# Patient Record
Sex: Male | Born: 1960 | Race: White | Hispanic: No | Smoking: Current every day smoker
Health system: Southern US, Community
[De-identification: ages and names within clinical notes are randomized; demographics above are authoritative.]

## PROBLEM LIST (undated history)

## (undated) DIAGNOSIS — I10 Essential (primary) hypertension: Secondary | ICD-10-CM

## (undated) DIAGNOSIS — J449 Chronic obstructive pulmonary disease, unspecified: Secondary | ICD-10-CM

## (undated) DIAGNOSIS — K219 Gastro-esophageal reflux disease without esophagitis: Secondary | ICD-10-CM

## (undated) HISTORY — PX: OTHER SURGICAL HISTORY: SHX169

---

## 2013-02-10 ENCOUNTER — Encounter (HOSPITAL_COMMUNITY): Payer: Self-pay

## 2013-02-10 ENCOUNTER — Emergency Department (HOSPITAL_COMMUNITY): Payer: 59

## 2013-02-10 ENCOUNTER — Emergency Department (HOSPITAL_COMMUNITY)
Admission: EM | Admit: 2013-02-10 | Discharge: 2013-02-11 | Disposition: A | Discharge status: Home / Self Care | Payer: 59 | Attending: Emergency Medicine | Admitting: Emergency Medicine

## 2013-02-10 DIAGNOSIS — J3489 Other specified disorders of nose and nasal sinuses: Secondary | ICD-10-CM | POA: Insufficient documentation

## 2013-02-10 DIAGNOSIS — R0682 Tachypnea, not elsewhere classified: Secondary | ICD-10-CM | POA: Insufficient documentation

## 2013-02-10 DIAGNOSIS — J441 Chronic obstructive pulmonary disease with (acute) exacerbation: Secondary | ICD-10-CM | POA: Insufficient documentation

## 2013-02-10 DIAGNOSIS — F17200 Nicotine dependence, unspecified, uncomplicated: Secondary | ICD-10-CM | POA: Insufficient documentation

## 2013-02-10 HISTORY — DX: Chronic obstructive pulmonary disease, unspecified: J44.9

## 2013-02-10 IMAGING — CR DG CHEST 1V PORT
1 series · 1 of 1 positions shown · non-contrast
Comparison: None.

CLINICAL DATA: Shortness of breath and wheezing.  History of COPD
and asthma.

PORTABLE CHEST - 1 VIEW

[portable]
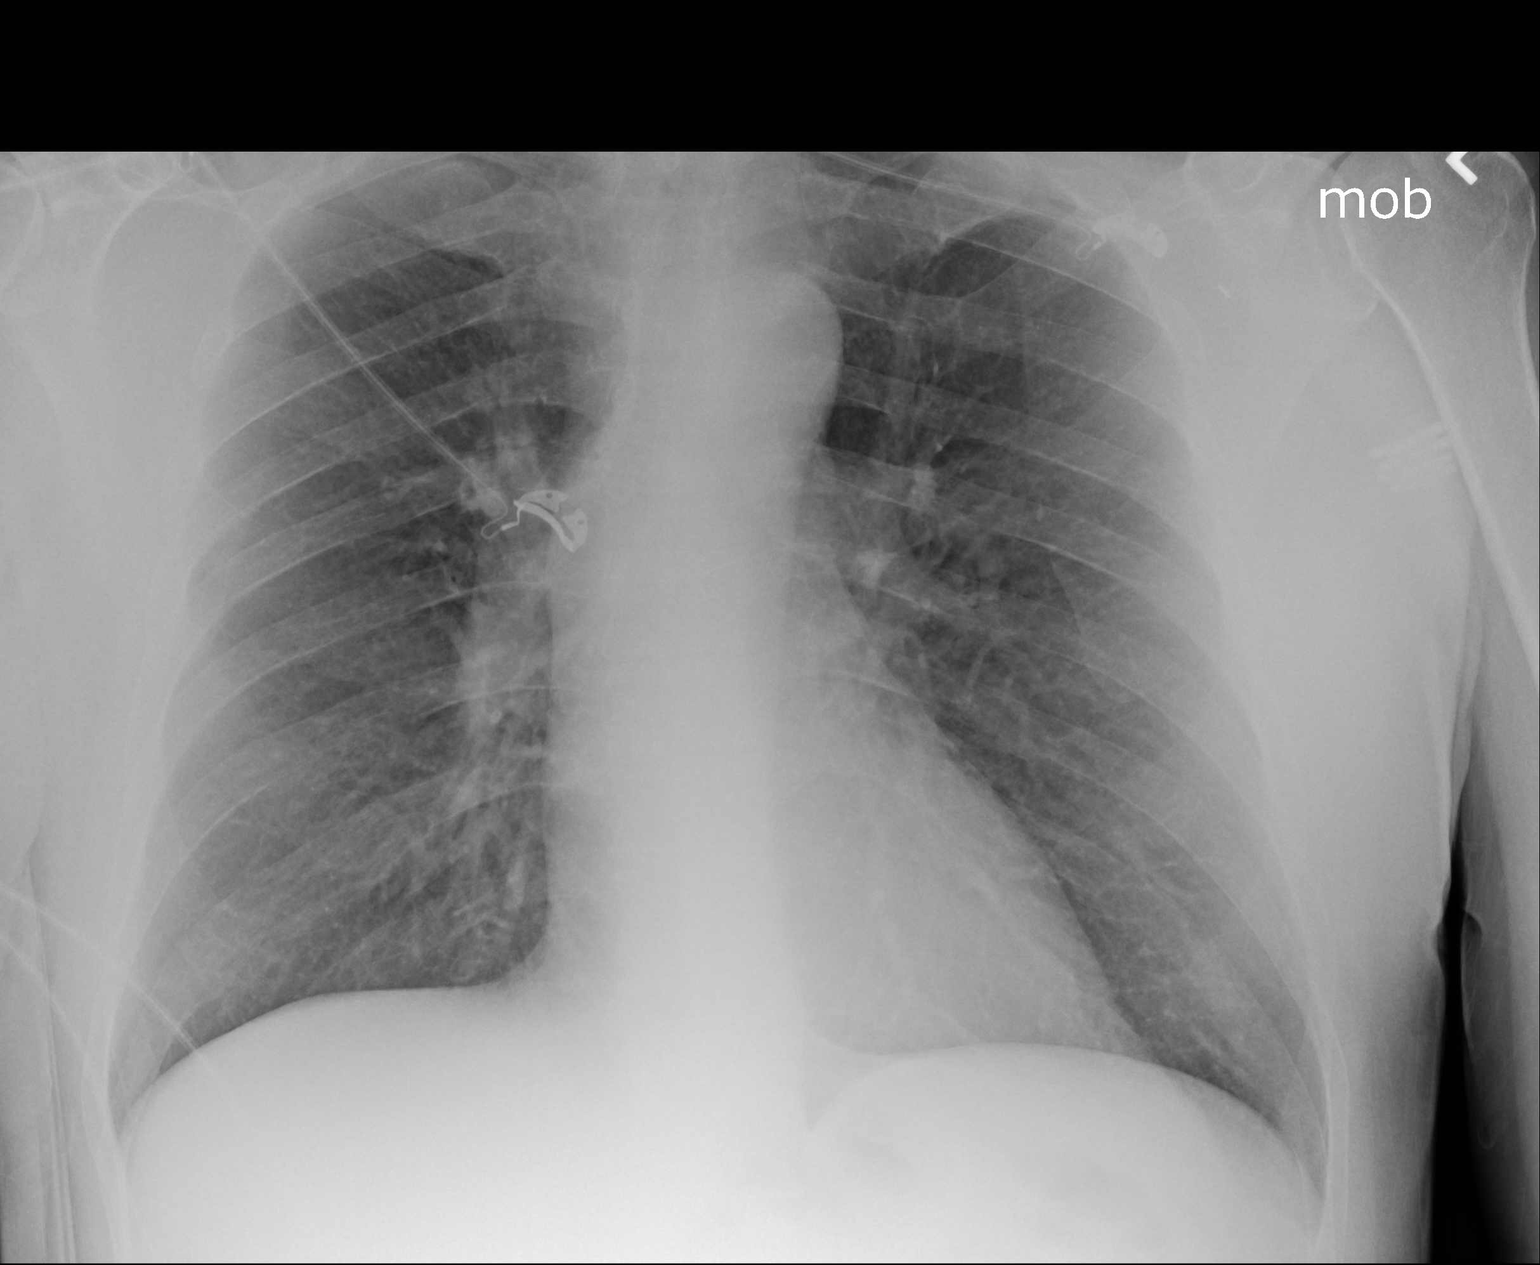

[1 of 1 positions shown; findings below may reference images not displayed]

FINDINGS: Mild hyperinflation and peribronchial thickening
compatible with history of COPD and asthma.  No focal consolidation
or airspace disease in the lungs.  No blunting of costophrenic
angles.  No pneumothorax.  Normal heart size and pulmonary
vascularity.  Mediastinal contours appear intact.
IMPRESSION: Mild hyperinflation and peribronchial changes compatible with
history of COPD and asthma.  No evidence of active pulmonary
disease.

## 2013-02-10 MED ORDER — ALBUTEROL SULFATE (5 MG/ML) 0.5% IN NEBU
5.0000 mg | INHALATION_SOLUTION | RESPIRATORY_TRACT | Status: DC
  Administered 2013-02-10: 5 mg via RESPIRATORY_TRACT
  Filled 2013-02-10: qty 1

## 2013-02-10 MED ORDER — IPRATROPIUM BROMIDE 0.02 % IN SOLN
0.5000 mg | Freq: Once | RESPIRATORY_TRACT | Status: AC
  Administered 2013-02-10: 0.5 mg via RESPIRATORY_TRACT
  Filled 2013-02-10: qty 2.5

## 2013-02-10 MED ORDER — METHYLPREDNISOLONE SODIUM SUCC 125 MG IJ SOLR
125.0000 mg | Freq: Once | INTRAMUSCULAR | Status: AC
  Administered 2013-02-10: 125 mg via INTRAVENOUS
  Filled 2013-02-10: qty 2

## 2013-02-10 NOTE — ED Notes (Signed)
Sob, chest pressure/tightness, wheezing

## 2013-02-10 NOTE — ED Provider Notes (Signed)
CSN: [id number]     Arrival date & time 02/10/13  2318 History   First MD Initiated Contact with Patient 02/10/13 2321     Chief Complaint  Patient presents with  . Shortness of Breath  . Wheezing   (Consider location/radiation/quality/duration/timing/severity/associated sxs/prior Treatment) HPI History provided by patient. Is a smoker with history of COPD. Presents with increased cough, wheezing and shortness of breath despite using inhaler at home. Recent sick contact with sinusitis and today patient is developing some runny nose and sinus pressure. No fevers. No hemoptysis. No productive sputum. He denies any chest pain, leg swelling or recent travel. Symptoms moderate in severity and worse with exertion. Past Medical History  Diagnosis Date  . COPD (chronic obstructive pulmonary disease)    History reviewed. No pertinent past surgical history. No family history on file. History  Substance Use Topics  . Smoking status: Current Every Day Smoker  . Smokeless tobacco: Not on file  . Alcohol Use: No    Review of Systems  Constitutional: Negative for fever and chills.  HENT: Positive for rhinorrhea and sinus pressure. Negative for neck pain and neck stiffness.   Eyes: Negative for pain.  Respiratory: Positive for cough, shortness of breath and wheezing.   Cardiovascular: Negative for chest pain and leg swelling.  Gastrointestinal: Negative for abdominal pain.  Genitourinary: Negative for dysuria.  Musculoskeletal: Negative for back pain.  Skin: Negative for rash.  Neurological: Negative for headaches.  All other systems reviewed and are negative.    Allergies  Review of patient's allergies indicates no known allergies.  Home Medications  No current outpatient prescriptions on file. BP 163/95  Temp(Src) 98.3 F (36.8 C) (Oral)  Resp 24  Ht 5\' 11"  (1.803 m)  Wt 210 lb (95.255 kg)  BMI 29.3 kg/m2  SpO2 95% Physical Exam  Constitutional: He is oriented to person, place,  and time. He appears well-developed and well-nourished.  HENT:  Head: Normocephalic and atraumatic.  Eyes: EOM are normal. Pupils are equal, round, and reactive to light.  Neck: Neck supple.  Cardiovascular: Normal rate, regular rhythm and intact distal pulses.   Pulmonary/Chest: No stridor.  Tachypneic with bilateral inspiratory and expiratory wheezes, and with prolonged expirations  Abdominal: Soft. He exhibits no distension. There is no tenderness.  Musculoskeletal: Normal range of motion. He exhibits no edema and no tenderness.  Neurological: He is alert and oriented to person, place, and time.  Skin: Skin is warm and dry.    ED Course  Procedures (including critical care time) Labs Review Labs Reviewed  BASIC METABOLIC PANEL - Abnormal; Notable for the following:    Glucose, Bld 110 (*)    GFR calc non Af Amer 67 (*)    GFR calc Af Amer 78 (*)    All other components within normal limits  CBC  TROPONIN I   Imaging Review Dg Chest Portable 1 View  02/10/2013   *RADIOLOGY REPORT*  Clinical Data: Shortness of breath and wheezing.  History of COPD and asthma.  PORTABLE CHEST - 1 VIEW  Comparison: None.  Findings: Mild hyperinflation and peribronchial thickening compatible with history of COPD and asthma.  No focal consolidation or airspace disease in the lungs.  No blunting of costophrenic angles.  No pneumothorax.  Normal heart size and pulmonary vascularity.  Mediastinal contours appear intact.  IMPRESSION: Mild hyperinflation and peribronchial changes compatible with history of COPD and asthma.  No evidence of active pulmonary disease.   Original Report Authenticated By: Burman Nieves,  M.D.     Date: 02/10/2013  Rate: 91  Rhythm: normal sinus rhythm  QRS Axis: normal  Intervals: normal  ST/T Wave abnormalities: nonspecific ST changes  Conduction Disutrbances:none  Narrative Interpretation:   Old EKG Reviewed: none available  IV steroids, albuterol/Atrovent  provided. 12:00 AM recheck, still wheezing, placed on continuous neb. 12:55 AM minimal improvement, still wheezing with increased work of breathing. I recommended the patient be admitted - he states understanding this 1:57 AM still wheezing. I had a long discussion with patient again recommended admission. Patient very much does not want to be admitted, states he needs to work in the morning and that he always wheezes and he believes that he will be okay.  He understands that he can return at any time. She given an inhaler to go home with. Prescription for albuterol, Spiriva, prednisone. He agrees to fill prescriptions and take them as prescribed. He agrees to return precautions and states he'll return if he gets worse. AMA paperwork provided.  MDM  Diagnosis: 1. Acute COPD exacerbation  2. Sinusitis 3. Borderline hypoxia  EKG. Chest x-ray. Labs.  Minimal improvement with medications provided  Patient left AMA despite strong recommendations for admission  Sunnie Nielsen, MD 02/11/13 4098

## 2013-02-10 NOTE — ED Notes (Signed)
Xray and RT at the bedside 

## 2013-02-11 LAB — CBC
MCH: 32.3 pg (ref 26.0–34.0)
MCHC: 34.6 g/dL (ref 30.0–36.0)
MCV: 93.4 fL (ref 78.0–100.0)
Platelets: 215 10*3/uL (ref 150–400)
RBC: 5.01 MIL/uL (ref 4.22–5.81)

## 2013-02-11 LAB — BASIC METABOLIC PANEL
CO2: 29 mEq/L (ref 19–32)
Calcium: 9.2 mg/dL (ref 8.4–10.5)
Creatinine, Ser: 1.2 mg/dL (ref 0.50–1.35)
Glucose, Bld: 110 mg/dL — ABNORMAL HIGH (ref 70–99)

## 2013-02-11 MED ORDER — KETOROLAC TROMETHAMINE 30 MG/ML IJ SOLN
INTRAMUSCULAR | Status: AC
  Administered 2013-02-11: 30 mg via INTRAVENOUS
  Filled 2013-02-11: qty 1

## 2013-02-11 MED ORDER — PREDNISONE 20 MG PO TABS: 60.0000 mg | ORAL_TABLET | Freq: Every day | ORAL | Status: DC

## 2013-02-11 MED ORDER — KETOROLAC TROMETHAMINE 30 MG/ML IJ SOLN
30.0000 mg | Freq: Once | INTRAMUSCULAR | Status: AC
  Administered 2013-02-11: 30 mg via INTRAVENOUS

## 2013-02-11 MED ORDER — TIOTROPIUM BROMIDE MONOHYDRATE 18 MCG IN CAPS: 18.0000 ug | ORAL_CAPSULE | Freq: Every day | RESPIRATORY_TRACT | Status: DC

## 2013-02-11 MED ORDER — ALBUTEROL SULFATE HFA 108 (90 BASE) MCG/ACT IN AERS: 1.0000 | INHALATION_SPRAY | Freq: Four times a day (QID) | RESPIRATORY_TRACT | Status: DC | PRN

## 2013-02-11 MED ORDER — ALBUTEROL SULFATE HFA 108 (90 BASE) MCG/ACT IN AERS
2.0000 | INHALATION_SPRAY | RESPIRATORY_TRACT | Status: DC | PRN
  Administered 2013-02-11: 2 via RESPIRATORY_TRACT
  Filled 2013-02-11: qty 6.7

## 2013-02-11 MED ORDER — ALBUTEROL SULFATE (5 MG/ML) 0.5% IN NEBU
10.0000 mg | INHALATION_SOLUTION | Freq: Once | RESPIRATORY_TRACT | Status: AC
  Administered 2013-02-11: 10 mg via RESPIRATORY_TRACT
  Filled 2013-02-11: qty 2

## 2013-02-11 NOTE — Progress Notes (Signed)
Pt started on cat albuterol

## 2013-02-11 NOTE — ED Notes (Signed)
Ambulated pt around nursing station with O2 Sats 87 for duration

## 2013-02-11 NOTE — ED Notes (Signed)
Patient given discharge instruction, verbalized understand. IV removed, band aid applied. Patient ambulatory out of the department with family 

## 2013-02-11 NOTE — ED Notes (Signed)
Pt complaining of headache, MD advised, orders given

## 2014-01-12 ENCOUNTER — Emergency Department (HOSPITAL_COMMUNITY)
Admission: EM | Admit: 2014-01-12 | Discharge: 2014-01-12 | Disposition: A | Discharge status: Home / Self Care | Payer: BC Managed Care – PPO | Attending: Emergency Medicine | Admitting: Emergency Medicine

## 2014-01-12 ENCOUNTER — Emergency Department (HOSPITAL_COMMUNITY): Payer: BC Managed Care – PPO

## 2014-01-12 ENCOUNTER — Encounter (HOSPITAL_COMMUNITY): Payer: Self-pay | Admitting: Emergency Medicine

## 2014-01-12 DIAGNOSIS — R0781 Pleurodynia: Secondary | ICD-10-CM

## 2014-01-12 DIAGNOSIS — Z7982 Long term (current) use of aspirin: Secondary | ICD-10-CM | POA: Insufficient documentation

## 2014-01-12 DIAGNOSIS — R079 Chest pain, unspecified: Secondary | ICD-10-CM | POA: Insufficient documentation

## 2014-01-12 DIAGNOSIS — Z79899 Other long term (current) drug therapy: Secondary | ICD-10-CM | POA: Insufficient documentation

## 2014-01-12 DIAGNOSIS — J449 Chronic obstructive pulmonary disease, unspecified: Secondary | ICD-10-CM | POA: Insufficient documentation

## 2014-01-12 DIAGNOSIS — J4489 Other specified chronic obstructive pulmonary disease: Secondary | ICD-10-CM | POA: Insufficient documentation

## 2014-01-12 DIAGNOSIS — F17200 Nicotine dependence, unspecified, uncomplicated: Secondary | ICD-10-CM | POA: Insufficient documentation

## 2014-01-12 MED ORDER — CYCLOBENZAPRINE HCL 10 MG PO TABS: 10.0000 mg | ORAL_TABLET | Freq: Two times a day (BID) | ORAL | Status: DC | PRN

## 2014-01-12 MED ORDER — PREDNISONE 50 MG PO TABS: ORAL_TABLET | ORAL | Status: DC

## 2014-01-12 MED ORDER — HYDROMORPHONE HCL PF 1 MG/ML IJ SOLN
1.0000 mg | Freq: Once | INTRAMUSCULAR | Status: AC
  Administered 2014-01-12: 1 mg via INTRAMUSCULAR
  Filled 2014-01-12: qty 1

## 2014-01-12 MED ORDER — OXYCODONE-ACETAMINOPHEN 5-325 MG PO TABS: 2.0000 | ORAL_TABLET | ORAL | Status: DC | PRN

## 2014-01-12 MED ORDER — HYDROMORPHONE HCL PF 1 MG/ML IJ SOLN
1.0000 mg | Freq: Once | INTRAMUSCULAR | Status: DC
  Filled 2014-01-12: qty 1

## 2014-01-12 NOTE — ED Provider Notes (Signed)
CSN: [id number]     Arrival date & time 01/12/14  1035 History  This chart was scribed for [name], MD by Leone Payor, ED Scribe. This patient was seen in room [facility] and the patient's care was started 11:16 AM.    Chief Complaint  Patient presents with  . Rib Injury    The history is provided by the patient. No language interpreter was used.    HPI Comments: Allen Austin is a 53 y.o. male with past medical history of COPD who presents to the Emergency Department complaining of constant, unchanged left inferior, posterior, lateral back pain that began 2 weeks ago. Patient states he was evaluated at an UC last week and was prescribed prednisone, muscle relaxants, and hydrocodone. He states he may have fallen but denies trauma to the affected area. He is able to ambulate. He states the pain is aggravated with deep breaths.   Past Medical History  Diagnosis Date  . COPD (chronic obstructive pulmonary disease)    Past Surgical History  Procedure Laterality Date  . Right knee     No family history on file. History  Substance Use Topics  . Smoking status: Current Every Day Smoker -- 2.00 packs/day    Types: Cigarettes  . Smokeless tobacco: Not on file  . Alcohol Use: Yes     Comment: weekends    Review of Systems  A complete 10 system review of systems was obtained and all systems are negative except as noted in the HPI and PMH.    Allergies  Review of patient's allergies indicates no known allergies.  Home Medications   Prior to Admission medications   Medication Sig Start Date End Date Taking? Authorizing Provider  aspirin EC 81 MG tablet Take 81 mg by mouth daily.   Yes Historical Provider, MD  aspirin-acetaminophen-caffeine (EXCEDRIN EXTRA STRENGTH) [phone] MG per tablet Take 3 tablets by mouth every 8 (eight) hours as needed (back pain).   Yes Historical Provider, MD  cyclobenzaprine (FLEXERIL) 10 MG tablet Take 1 tablet (10 mg total) by mouth 2 (two) times daily  as needed for muscle spasms. 01/12/14   [name], MD  oxyCODONE-acetaminophen (PERCOCET) 5-325 MG per tablet Take 2 tablets by mouth every 4 (four) hours as needed. 01/12/14   [name], MD  predniSONE (DELTASONE) 50 MG tablet 1 tablet daily for 7 days 01/12/14   [name], MD  tiotropium (SPIRIVA HANDIHALER) 18 MCG inhalation capsule Place 1 capsule (18 mcg total) into inhaler and inhale daily. 02/11/13   Sunnie Nielsen, MD   BP 138/119  Pulse 70  Temp(Src) 97.9 F (36.6 C) (Oral)  Resp 20  Ht 5\' 11"  (1.803 m)  Wt 230 lb (104.327 kg)  BMI 32.09 kg/m2  SpO2 93% Physical Exam  Nursing note and vitals reviewed. Constitutional: He is oriented to person, place, and time. He appears well-developed and well-nourished.  HENT:  Head: Normocephalic and atraumatic.  Eyes: Conjunctivae and EOM are normal. Pupils are equal, round, and reactive to light.  Neck: Normal range of motion. Neck supple.  Cardiovascular: Normal rate, regular rhythm and normal heart sounds.   Pulmonary/Chest: Effort normal and breath sounds normal.  Abdominal: Soft. Bowel sounds are normal.  Musculoskeletal: Normal range of motion.  Tender to inferior, posterior, lateral rib area.   Neurological: He is alert and oriented to person, place, and time.  Skin: Skin is warm and dry.  Psychiatric: He has a normal mood and affect. His behavior is normal.  ED Course  Procedures (including critical care time)  DIAGNOSTIC STUDIES: Oxygen Saturation is 98% on RA, normal by my interpretation.    COORDINATION OF CARE: 11:20 AM Discussed treatment plan with pt at bedside and pt agreed to plan.   Labs Review Labs Reviewed - No data to display  Imaging Review Dg Chest 2 View  01/12/2014   CLINICAL DATA:  Rib injury.  EXAM: CHEST - 2 VIEW  COMPARISON:  02/10/2013  FINDINGS: The heart size and mediastinal contours are within normal limits. There is no evidence of pulmonary edema, consolidation, pneumothorax, nodule or pleural fluid.  Stable small piece of shrapnel near the left rib margin and scapula. No visible rib fractures or bony lesions. The thoracic spine appears normal in the lateral projection.  IMPRESSION: No active disease.   Electronically Signed   By: Irish LackGlenn  Yamagata M.D.   On: 01/12/2014 11:49     EKG Interpretation None      MDM   Final diagnoses:  Rib pain on left side    Plain films of the chest show no rib fracture, pneumothorax, consolidation.   Discharge medications Percocet, Flexeril 10 mg, prednisone  I personally performed the services described in this documentation, which was scribed in my presence. The recorded information has been reviewed and is accurate.   Donnetta HutchingBrian Vartan Kerins, MD 01/12/14 1330

## 2014-01-12 NOTE — ED Notes (Signed)
PT stated he may have fell about 2 weeks ago and was seen last Monday at urgent care and was given steriod/valium/hydrocodone. PT has had no relief and pain is worse when he breaths in.

## 2014-01-12 NOTE — Discharge Instructions (Signed)
X-ray shows no obvious broken ribs. Medication for pain, muscle spasm, prednisone. Stop smoking.

## 2014-01-12 NOTE — ED Notes (Signed)
Pt family member out to nursing desk requesting something for pain for pt, update given, Dr Adriana Simasook notified, additional orders provided,

## 2014-09-23 ENCOUNTER — Other Ambulatory Visit (HOSPITAL_COMMUNITY): Payer: Self-pay | Admitting: Internal Medicine

## 2014-09-23 DIAGNOSIS — Z122 Encounter for screening for malignant neoplasm of respiratory organs: Secondary | ICD-10-CM

## 2014-09-29 ENCOUNTER — Ambulatory Visit (HOSPITAL_COMMUNITY)
Admission: RE | Admit: 2014-09-29 | Discharge: 2014-09-29 | Disposition: A | Discharge status: Home / Self Care | Payer: BLUE CROSS/BLUE SHIELD | Source: Ambulatory Visit | Attending: Internal Medicine | Admitting: Internal Medicine

## 2014-09-29 DIAGNOSIS — Z122 Encounter for screening for malignant neoplasm of respiratory organs: Secondary | ICD-10-CM | POA: Insufficient documentation

## 2014-09-29 DIAGNOSIS — Z720 Tobacco use: Secondary | ICD-10-CM | POA: Diagnosis not present

## 2018-09-02 ENCOUNTER — Other Ambulatory Visit: Payer: Self-pay

## 2018-09-02 ENCOUNTER — Encounter (HOSPITAL_COMMUNITY): Payer: Self-pay | Admitting: Emergency Medicine

## 2018-09-02 ENCOUNTER — Emergency Department (HOSPITAL_COMMUNITY)
Admission: EM | Admit: 2018-09-02 | Discharge: 2018-09-02 | Disposition: A | Discharge status: Home / Self Care | Payer: 59 | Attending: Emergency Medicine | Admitting: Emergency Medicine

## 2018-09-02 DIAGNOSIS — J449 Chronic obstructive pulmonary disease, unspecified: Secondary | ICD-10-CM | POA: Insufficient documentation

## 2018-09-02 DIAGNOSIS — I10 Essential (primary) hypertension: Secondary | ICD-10-CM | POA: Diagnosis not present

## 2018-09-02 DIAGNOSIS — Z7982 Long term (current) use of aspirin: Secondary | ICD-10-CM | POA: Insufficient documentation

## 2018-09-02 DIAGNOSIS — J069 Acute upper respiratory infection, unspecified: Secondary | ICD-10-CM | POA: Diagnosis not present

## 2018-09-02 DIAGNOSIS — R05 Cough: Secondary | ICD-10-CM | POA: Diagnosis present

## 2018-09-02 DIAGNOSIS — Z79899 Other long term (current) drug therapy: Secondary | ICD-10-CM | POA: Diagnosis not present

## 2018-09-02 DIAGNOSIS — F17210 Nicotine dependence, cigarettes, uncomplicated: Secondary | ICD-10-CM | POA: Diagnosis not present

## 2018-09-02 HISTORY — DX: Gastro-esophageal reflux disease without esophagitis: K21.9

## 2018-09-02 HISTORY — DX: Essential (primary) hypertension: I10

## 2018-09-02 MED ORDER — AMOXICILLIN 500 MG PO CAPS
1000.0000 mg | ORAL_CAPSULE | Freq: Three times a day (TID) | ORAL | 0 refills | Status: AC
Start: 2018-09-02 — End: ?

## 2018-09-02 MED ORDER — AMOXICILLIN 250 MG PO CAPS
1000.0000 mg | ORAL_CAPSULE | Freq: Once | ORAL | Status: AC
  Administered 2018-09-02: 1000 mg via ORAL
  Filled 2018-09-02: qty 4

## 2018-09-02 MED ORDER — AZITHROMYCIN 250 MG PO TABS: ORAL_TABLET | ORAL | 0 refills | Status: AC

## 2018-09-02 MED ORDER — AZITHROMYCIN 250 MG PO TABS
500.0000 mg | ORAL_TABLET | Freq: Once | ORAL | Status: AC
  Administered 2018-09-02: 500 mg via ORAL
  Filled 2018-09-02: qty 2

## 2018-09-02 MED ORDER — AMOXICILLIN 250 MG PO CAPS
ORAL_CAPSULE | ORAL | Status: AC
  Filled 2018-09-02: qty 4

## 2018-09-02 NOTE — ED Provider Notes (Addendum)
[facility] EMERGENCY DEPARTMENT Provider Note   CSN: [id number] Arrival date & time: 09/02/18  1829    History   Chief Complaint Chief Complaint  Patient presents with  . Cough    HPI Allen Austin is a 58 y.o. male.     Cough with productive green sputum, achiness, questionable fever for several days.  Patient is a known history of COPD.  He was recently in [place].  He is asking questions about an air embolism secondary to injecting cocaine.  No substernal chest pain, dyspnea on exertion.  He is using an inhaler at home.  Severity of symptoms mild to moderate.  Nothing makes symptoms better or worse.     Past Medical History:  Diagnosis Date  . COPD (chronic obstructive pulmonary disease) (HCC)   . GERD (gastroesophageal reflux disease)   . Hypertension     There are no active problems to display for this patient.   Past Surgical History:  Procedure Laterality Date  . right knee          Home Medications    Prior to Admission medications   Medication Sig Start Date End Date Taking? Authorizing Provider  albuterol (PROVENTIL HFA;VENTOLIN HFA) 108 (90 Base) MCG/ACT inhaler Inhale 1-2 puffs into the lungs every 6 (six) hours as needed for wheezing or shortness of breath.    Yes [provider]  aspirin EC 81 MG tablet Take 81 mg by mouth daily.   Yes [provider]  benazepril (LOTENSIN) 20 MG tablet Take 20 mg by mouth daily.    Yes [provider]  hydrochlorothiazide (HYDRODIURIL) 25 MG tablet Take 25 mg by mouth daily.    Yes [provider]  raNITIdine HCl (ACID CONTROL PO) Take 1 tablet by mouth daily. NAME UNKNOWN   Yes [provider]  UNKNOWN TO PATIENT Inhale 1 puff into the lungs daily. For COPD   Yes [provider]  amoxicillin (AMOXIL) 500 MG capsule Take 2 capsules (1,000 mg total) by mouth 3 (three) times daily. 09/02/18   [name], MD  azithromycin [facility]) 250 MG tablet 1 tablet daily  starting Monday evening for 4 more days 09/02/18   [name], MD    Family History No family history on file.  Social History Social History   Tobacco Use  . Smoking status: Current Every Day Smoker    Packs/day: 2.00    Types: Cigarettes  . Smokeless tobacco: Never Used  Substance Use Topics  . Alcohol use: Yes    Comment: very occasional  . Drug use: Yes    Types: Cocaine     Allergies   Patient has no known allergies.   Review of Systems Review of Systems  All other systems reviewed and are negative.    Physical Exam Updated Vital Signs BP (S) (!) 162/113 (BP Location: Right Arm)   Temp 99.6 F (37.6 C) (Oral)   Resp 18   Ht 5\' 11"  (1.803 m)   Wt 104.3 kg   SpO2 95%   BMI 32.08 kg/m   Physical Exam Vitals signs and nursing note reviewed.  Constitutional:      Appearance: He is well-developed.     Comments: No acute distress; oxygenating well.  HENT:     Head: Normocephalic and atraumatic.  Eyes:     Conjunctiva/sclera: Conjunctivae normal.  Neck:     Musculoskeletal: Neck supple.  Cardiovascular:     Rate and Rhythm: Normal rate and regular rhythm.  Pulmonary:  Effort: Pulmonary effort is normal.     Breath sounds: Normal breath sounds.  Abdominal:     General: Bowel sounds are normal.     Palpations: Abdomen is soft.  Musculoskeletal: Normal range of motion.  Skin:    General: Skin is warm and dry.  Neurological:     Mental Status: He is alert and oriented to person, place, and time.  Psychiatric:        Behavior: Behavior normal.      ED Treatments / Results  Labs (all labs ordered are listed, but only abnormal results are displayed) Labs Reviewed - No data to display  EKG None  Radiology No results found.  Procedures Procedures (including critical care time)  Medications Ordered in ED Medications  amoxicillin (AMOXIL) capsule 1,000 mg (1,000 mg Oral Given 09/02/18 1934)  azithromycin (ZITHROMAX) tablet 500 mg (500 mg  Oral Given 09/02/18 1934)     Initial Impression / Assessment and Plan / ED Course  I have reviewed the triage vital signs and the nursing notes.  Pertinent labs & imaging results that were available during my care of the patient were reviewed by me and considered in my medical decision making (see chart for details).        Patient has COPD and possible covid exposure.  He is hemodynamically stable.  Pulse ox in the high 90s.  No evidence of airway compromise.  Will minimize testing and Rx amoxicillin 1000 mg and Zithromax for potential bacterial source.  Leovardo Zwiers was evaluated in Emergency Department on 09/02/2018 for the symptoms described in the history of present illness. He was evaluated in the context of the global COVID-19 pandemic, which necessitated consideration that the patient might be at risk for infection with the SARS-CoV-2 virus that causes COVID-19. Institutional protocols and algorithms that pertain to the evaluation of patients at risk for COVID-19 are in a state of rapid change based on information released by regulatory bodies including the CDC and federal and state organizations. These policies and algorithms were followed during the patient's care in the ED.  Final Clinical Impressions(s) / ED Diagnoses   Final diagnoses:  Upper respiratory tract infection, unspecified type    ED Discharge Orders         Ordered    amoxicillin (AMOXIL) 500 MG capsule  3 times daily     09/02/18 1922    azithromycin (ZITHROMAX) 250 MG tablet     09/02/18 [name], MD 09/02/18 1941    [name], MD 09/02/18 1950    [name], MD 09/02/18 2130

## 2018-09-02 NOTE — ED Notes (Signed)
Patient being discharged. Unable to get patient to sign. Patient given discharge instructions

## 2018-09-02 NOTE — ED Triage Notes (Addendum)
Patient c/o new onset of productive cough and ? fever with body aches. Patient reports being in [facility]. Patient states sputum thick and green. 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

## 2018-09-02 NOTE — Discharge Instructions (Addendum)
You must self isolate until you are no longer coughing and having a fever.  No contact with other people.  I have E prescribed your prescriptions to your pharmacy.  One of your friends will need to pick them up.
